# Patient Record
Sex: Female | Born: 1965 | Race: White | Hispanic: No | Smoking: Never smoker
Health system: Southern US, Community
[De-identification: ages and names within clinical notes are randomized; demographics above are authoritative.]

## PROBLEM LIST (undated history)

## (undated) DIAGNOSIS — I00 Rheumatic fever without heart involvement: Secondary | ICD-10-CM

## (undated) HISTORY — DX: Rheumatic fever without heart involvement: I00

## (undated) HISTORY — PX: OTHER SURGICAL HISTORY: SHX169

## (undated) HISTORY — PX: HYSTERECTOMY ABDOMINAL WITH SALPINGECTOMY: SHX6725

---

## 2015-02-07 ENCOUNTER — Ambulatory Visit (INDEPENDENT_AMBULATORY_CARE_PROVIDER_SITE_OTHER): Payer: BLUE CROSS/BLUE SHIELD | Admitting: Podiatry

## 2015-02-07 ENCOUNTER — Encounter: Payer: Self-pay | Admitting: Podiatry

## 2015-02-07 VITALS — BP 137/87 | HR 69 | Ht 66.0 in | Wt 151.0 lb

## 2015-02-07 DIAGNOSIS — M21969 Unspecified acquired deformity of unspecified lower leg: Secondary | ICD-10-CM

## 2015-02-07 DIAGNOSIS — M722 Plantar fascial fibromatosis: Secondary | ICD-10-CM | POA: Diagnosis not present

## 2015-02-07 DIAGNOSIS — M79673 Pain in unspecified foot: Secondary | ICD-10-CM

## 2015-02-07 NOTE — Patient Instructions (Signed)
Seen for right heel pain.  Cortisone injection given. Metatarsal binder x 1, size small dispensed. Return for custom orthotics.

## 2015-02-07 NOTE — Progress Notes (Signed)
Subjective: 49 year old female presents complaining of pain in right heel duration of 2 months. On feet 8-10 hours a day. Pain under right heel. Runs but not lately. Last ran on 01/04/15. Been stretching, wearing night splint and got arch support. These have not stopped her foot pain. Helps some but still hurting.  Pain starts when she gets up in the morning.   Review of Systems - Negative except Scoliosis and foot pain.  Objective: Neurovascular status are within normal. Vascular status are within normal. 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  Pain under right heel with pressure.   Assessment: Plantar fasciitis right heel. Metatarsal deformity right, elevated.   Plan:  Reviewed clinical findings and available treatment optiosn  Right heel injected with cortisone. Injected with mixture of 4 mg Dexamethasone, 4 mg Triamcinolone, and 1 cc of 0.5% Marcaine plain. Patient tolerated well without difficulty.  Metatarsal binder dispensed for right. Need custom orthotics. Continue stretch exercise and night splint.

## 2015-02-13 ENCOUNTER — Encounter: Payer: Self-pay | Admitting: Podiatry

## 2015-02-13 ENCOUNTER — Ambulatory Visit (INDEPENDENT_AMBULATORY_CARE_PROVIDER_SITE_OTHER): Payer: BLUE CROSS/BLUE SHIELD | Admitting: Podiatry

## 2015-02-13 DIAGNOSIS — M216X9 Other acquired deformities of unspecified foot: Secondary | ICD-10-CM

## 2015-02-13 DIAGNOSIS — M21969 Unspecified acquired deformity of unspecified lower leg: Secondary | ICD-10-CM | POA: Diagnosis not present

## 2015-02-13 DIAGNOSIS — M79673 Pain in unspecified foot: Secondary | ICD-10-CM | POA: Diagnosis not present

## 2015-02-13 DIAGNOSIS — M722 Plantar fascial fibromatosis: Secondary | ICD-10-CM

## 2015-02-13 NOTE — Patient Instructions (Signed)
Seen for right heel pain. Injection repeated on right heel. Ankle brace dispensed. Return for custom orthotics.

## 2015-02-13 NOTE — Progress Notes (Signed)
Subjective: 49 year old female presents stating right heel is still painful. The last injection helped for a week. Pain is not as bad as before, but still painful. The night splint is helping in the morning.   Objective: Neurovascular status are within normal. Vascular status are within normal. 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  Pain under right heel with pressure.  Radiographic examination reveal high arched foot with normal CYMA line. Severely elevated first ray bilateral noted. AP view shoe normal alignments.   Assessment: Plantar fasciitis right heel. Metatarsal deformity right, elevated.   Plan:  Reviewed clinical findings, X-ray findings, and available treatment options. Right heel repeated injection with cortisone. Injected with mixture of 4 mg Dexamethasone, 4 mg Triamcinolone, and 1 cc of 0.5% Marcaine plain. Patient tolerated well without difficulty.  Ankle brace dispensed.  Reviewed the need for custom orthotics. Continue stretch exercise and night splint.

## 2017-05-08 ENCOUNTER — Emergency Department (HOSPITAL_BASED_OUTPATIENT_CLINIC_OR_DEPARTMENT_OTHER)
Admission: EM | Admit: 2017-05-08 | Discharge: 2017-05-09 | Disposition: A | Discharge status: Home / Self Care | Payer: BLUE CROSS/BLUE SHIELD | Attending: Emergency Medicine | Admitting: Emergency Medicine

## 2017-05-08 ENCOUNTER — Encounter (HOSPITAL_BASED_OUTPATIENT_CLINIC_OR_DEPARTMENT_OTHER): Payer: Self-pay | Admitting: Emergency Medicine

## 2017-05-08 DIAGNOSIS — Y92488 Other paved roadways as the place of occurrence of the external cause: Secondary | ICD-10-CM | POA: Diagnosis not present

## 2017-05-08 DIAGNOSIS — S59901A Unspecified injury of right elbow, initial encounter: Secondary | ICD-10-CM | POA: Diagnosis present

## 2017-05-08 DIAGNOSIS — F10920 Alcohol use, unspecified with intoxication, uncomplicated: Secondary | ICD-10-CM

## 2017-05-08 DIAGNOSIS — F10929 Alcohol use, unspecified with intoxication, unspecified: Secondary | ICD-10-CM | POA: Diagnosis not present

## 2017-05-08 DIAGNOSIS — Z87891 Personal history of nicotine dependence: Secondary | ICD-10-CM | POA: Diagnosis not present

## 2017-05-08 DIAGNOSIS — Y999 Unspecified external cause status: Secondary | ICD-10-CM | POA: Diagnosis not present

## 2017-05-08 DIAGNOSIS — W19XXXA Unspecified fall, initial encounter: Secondary | ICD-10-CM

## 2017-05-08 DIAGNOSIS — Y9301 Activity, walking, marching and hiking: Secondary | ICD-10-CM | POA: Insufficient documentation

## 2017-05-08 DIAGNOSIS — W01198A Fall on same level from slipping, tripping and stumbling with subsequent striking against other object, initial encounter: Secondary | ICD-10-CM | POA: Diagnosis not present

## 2017-05-08 DIAGNOSIS — S42431A Displaced fracture (avulsion) of lateral epicondyle of right humerus, initial encounter for closed fracture: Secondary | ICD-10-CM | POA: Insufficient documentation

## 2017-05-08 NOTE — ED Triage Notes (Signed)
PT presents with fall and right elbow pain. Pt states she fell on concrete

## 2017-05-08 NOTE — ED Provider Notes (Signed)
[facility]   CSN: [id number] Arrival date & time: 05/08/17  2340  By signing my name below, I, Tara BrownerJennifer Sharp, attest that this documentation has been prepared under the direction and in the presence of Tara Sharp, Tara Maskerourtney F, MD. Electronically Signed: Diona BrownerJennifer Sharp, ED Scribe. 05/08/17. 11:58 PM.  History   Chief Complaint Chief Complaint  Patient presents with  . Fall    HPI Tara DallasSusan Hicks is a 51 y.o. female who presents to the Emergency Department complaining of right elbow pain s/p falling on concrete shortly prior to arrival. Pt was drinking and lost her balance. Reports that they noted a deformity to the elbow and "my arm was in the wrong direction." She has had multiple mixed drinks this evening, 05/08/17. Denies other injury. Rates pain at 4 out of 10. No hx heart disease. Pt denies LOC, head injury, and tingling.  The history is provided by the patient. No language interpreter was used.    History reviewed. No pertinent past medical history.  Patient Active Problem List   Diagnosis Date Noted  . Plantar fasciitis of right foot 02/07/2015  . Metatarsal deformity 02/07/2015    History reviewed. No pertinent surgical history.  OB History    No data available       Home Medications    Prior to Admission medications   Medication Sig Start Date End Date Taking? Authorizing Provider  HYDROcodone-acetaminophen (NORCO/VICODIN) 5-325 MG tablet Take 1 tablet by mouth every 6 (six) hours as needed. 05/09/17   Roran Wegner, Tara Maskerourtney F, MD    Family History No family history on file.  Social History Social History  Substance Use Topics  . Smoking status: Former Games developermoker  . Smokeless tobacco: Never Used  . Alcohol use Not on file     Allergies   Patient has no known allergies.   Review of Systems Review of Systems  Musculoskeletal: Positive for arthralgias.  Neurological: Negative for syncope, weakness and numbness.       -head injury. -  tingling.  All other systems reviewed and are negative.    Physical Exam Updated Vital Signs BP 106/70 (BP Location: Left Arm)   Pulse 72   Resp 18   Ht 5\' 6"  (1.676 m)   Wt 72.6 kg (160 lb)   LMP 04/15/2017   SpO2 96%   BMI 25.82 kg/m   Physical Exam  Constitutional:  Intoxicated middle-aged female in no acute distress  HENT:  Head: Normocephalic and atraumatic.  Cardiovascular: Normal rate, regular rhythm and normal heart sounds.   Pulmonary/Chest: Effort normal and breath sounds normal. No respiratory distress. She has no wheezes.  Abdominal: Soft. There is no tenderness.  Musculoskeletal:  No obvious deformity to the elbow, no open wounds, patient with range of motion however cannot fully extend the elbow secondary to pain, 2+ radial pulse, neurovascularly intact distally, no wrist or shoulder deformities with normal range of motion  Neurological: She is alert.  Skin: Skin is warm and dry.  Psychiatric: She has a normal mood and affect.  Intoxicated  Nursing note and vitals reviewed.    ED Treatments / Results  DIAGNOSTIC STUDIES: Oxygen Saturation is 96% on RA, adequate by my interpretation.   COORDINATION OF CARE: 11:58 PM-Discussed next steps with pt which includes an XR. Pt verbalized understanding and is agreeable with the plan.   Labs (all labs ordered are listed, but only abnormal results are displayed) Labs Reviewed - No data to display  EKG  EKG Interpretation  None       Radiology Dg Elbow Complete Right  Result Date: 05/09/2017 CLINICAL DATA:  Status post fall, with right elbow pain. Initial encounter. EXAM: RIGHT ELBOW - COMPLETE 3+ VIEW COMPARISON:  None. FINDINGS: Multiple apparent avulsion fragments are seen arising about the lateral humeral condyle. An elbow joint effusion is suspected. The radial head and proximal ulna appear grossly intact, with mild degenerative change about the coronoid process. No definite soft tissue abnormalities are  otherwise seen. IMPRESSION: Multiple apparent avulsion fracture fragments arising about the lateral humeral condyle. Suspect elbow joint effusion. Electronically Signed   By: Tara Sharp M.D.   On: 05/09/2017 00:19    Procedures Procedures (including critical care time)  Medications Ordered in ED Medications  ibuprofen (ADVIL,MOTRIN) 800 MG tablet (800 mg  Given 05/09/17 0108)     Initial Impression / Assessment and Plan / ED Course  I have reviewed the triage vital signs and the nursing notes.  Pertinent labs & imaging results that were available during my care of the patient were reviewed by me and considered in my medical decision making (see chart for details).     Patient presents following a fall. Reports mechanical fall area is intoxicated. Only endorses pain to the right elbow. No obvious deformity at this time but reported deformity prior to arrival. She's neurovascularly intact. Somewhat limited extension but can range both flex and extend at the elbow. It does not appear open. X-rays with multiple small avulsion fractures of the lateral condyle as well as a joint effusion which could represent an occult radial head fracture. For this reason, place and was placed in a posterior splint. Her exam is somewhat limited with her alcohol intoxication. Repeat exam I can extend her elbow fully. Feel she needs a formal repeat exam when she is not intoxicated. I discussed this with her and her significant other at the bedside. Patient stated understanding.  After history, exam, and medical workup I feel the patient has been appropriately medically screened and is safe for discharge home. Pertinent diagnoses were discussed with the patient. Patient was given return precautions.   Final Clinical Impressions(s) / ED Diagnoses   Final diagnoses:  Fall, initial encounter  Closed displaced avulsion fracture of lateral epicondyle of right humerus, initial encounter  Alcoholic intoxication  without complication (HCC)    New Prescriptions There are no discharge medications for this patient.  I personally performed the services described in this documentation, which was scribed in my presence. The recorded information has been reviewed and is accurate.     Shon Baton, MD 05/09/17 [phone]

## 2017-05-09 ENCOUNTER — Emergency Department (HOSPITAL_BASED_OUTPATIENT_CLINIC_OR_DEPARTMENT_OTHER): Payer: BLUE CROSS/BLUE SHIELD

## 2017-05-09 MED ORDER — HYDROCODONE-ACETAMINOPHEN 5-325 MG PO TABS: 1.0000 | ORAL_TABLET | Freq: Four times a day (QID) | ORAL | 0 refills | Status: DC | PRN

## 2017-05-09 MED ORDER — IBUPROFEN 800 MG PO TABS
ORAL_TABLET | ORAL | Status: AC
  Administered 2017-05-09: 800 mg
  Filled 2017-05-09: qty 1

## 2017-05-09 NOTE — Discharge Instructions (Signed)
You were seen today for injury to right elbow. You have small avulsion fractures over the lateral condyle of the elbow. Given limited exam, need to have repeat exam Monday or Tuesday of this week for range of motion and further evaluation. You will be placed in a splint until that time.

## 2018-07-01 IMAGING — DX DG ELBOW COMPLETE 3+V*R*
4 series · 4 of 4 positions shown · non-contrast
Comparison: None.

CLINICAL DATA: Status post fall, with right elbow pain. Initial
encounter.

EXAM:
RIGHT ELBOW - COMPLETE 3+ VIEW

[elbow ap]
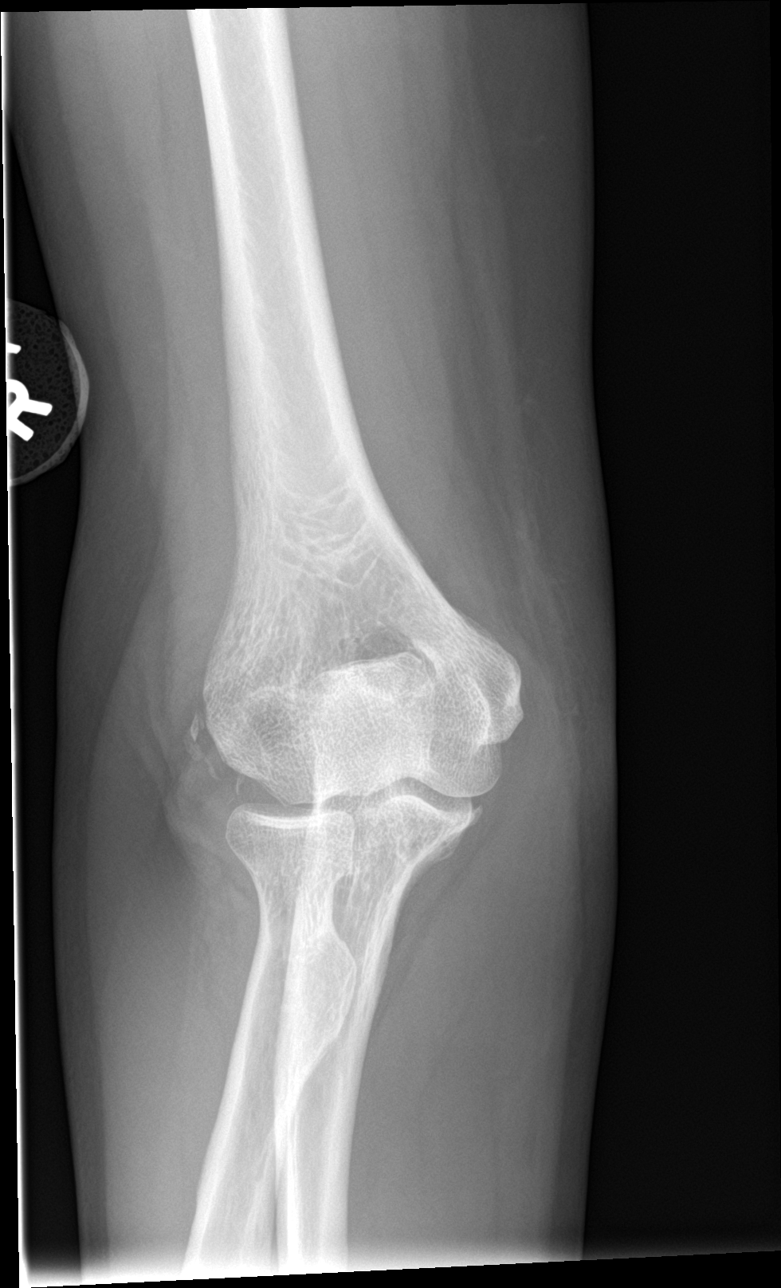

[elbow obl (1 of 2)]
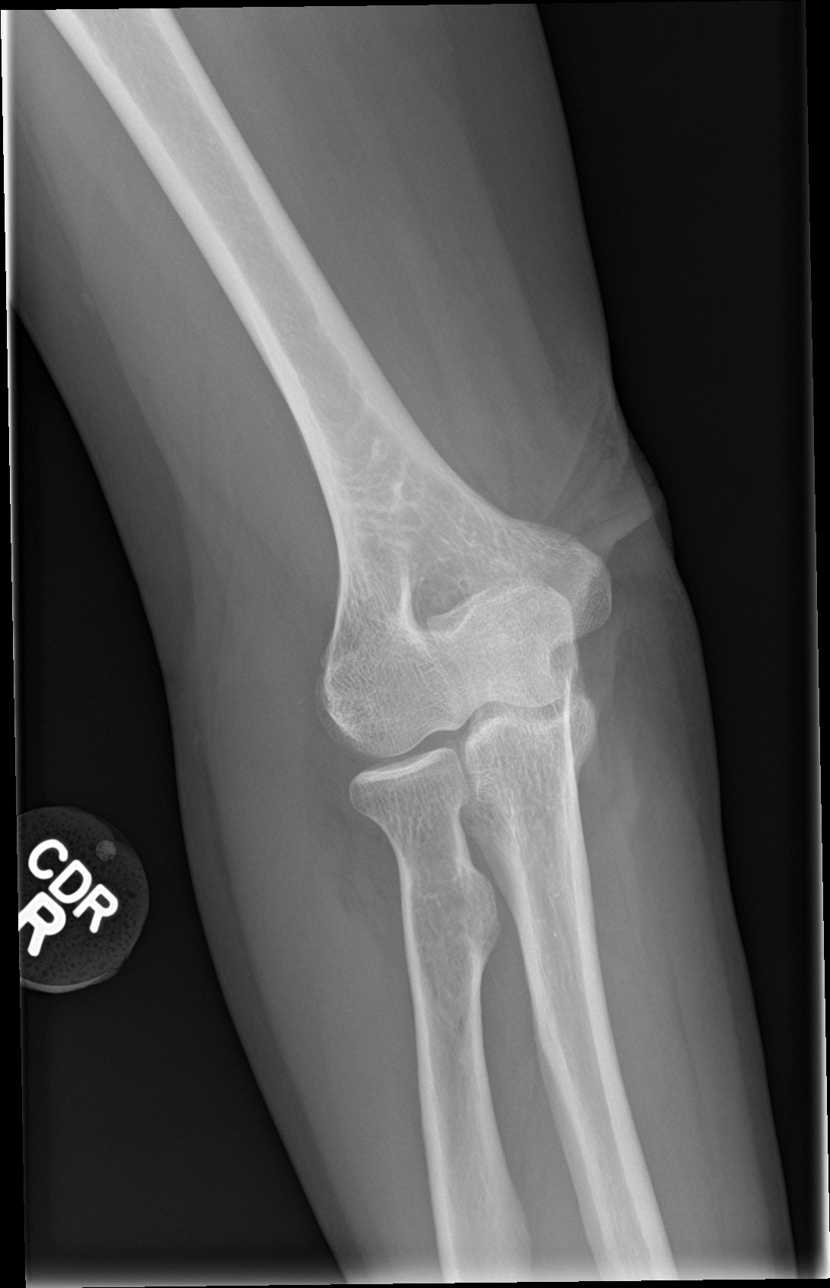

[elbow obl (2 of 2)]
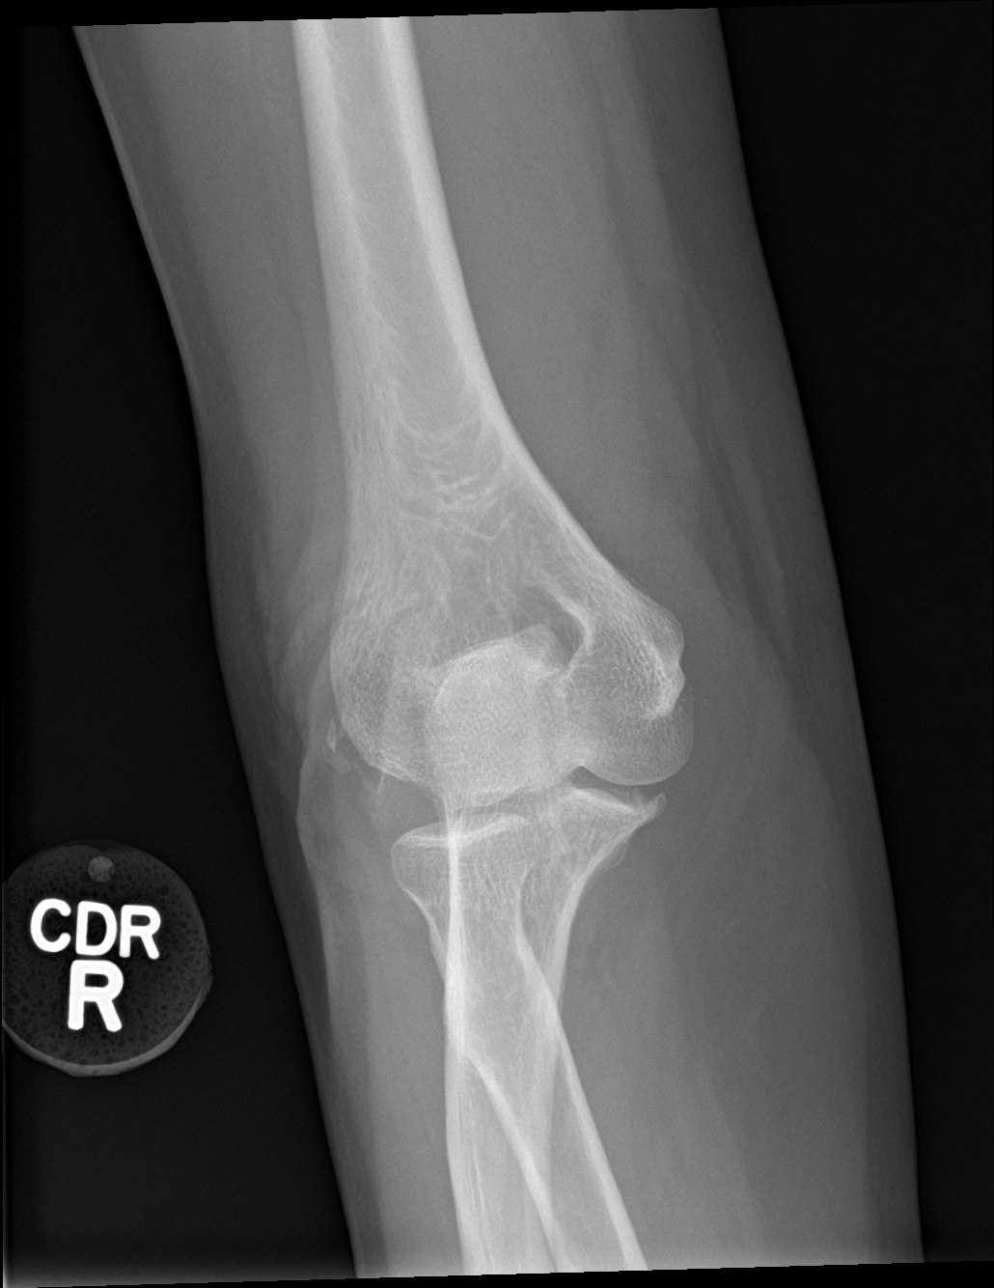

[elbow lat]
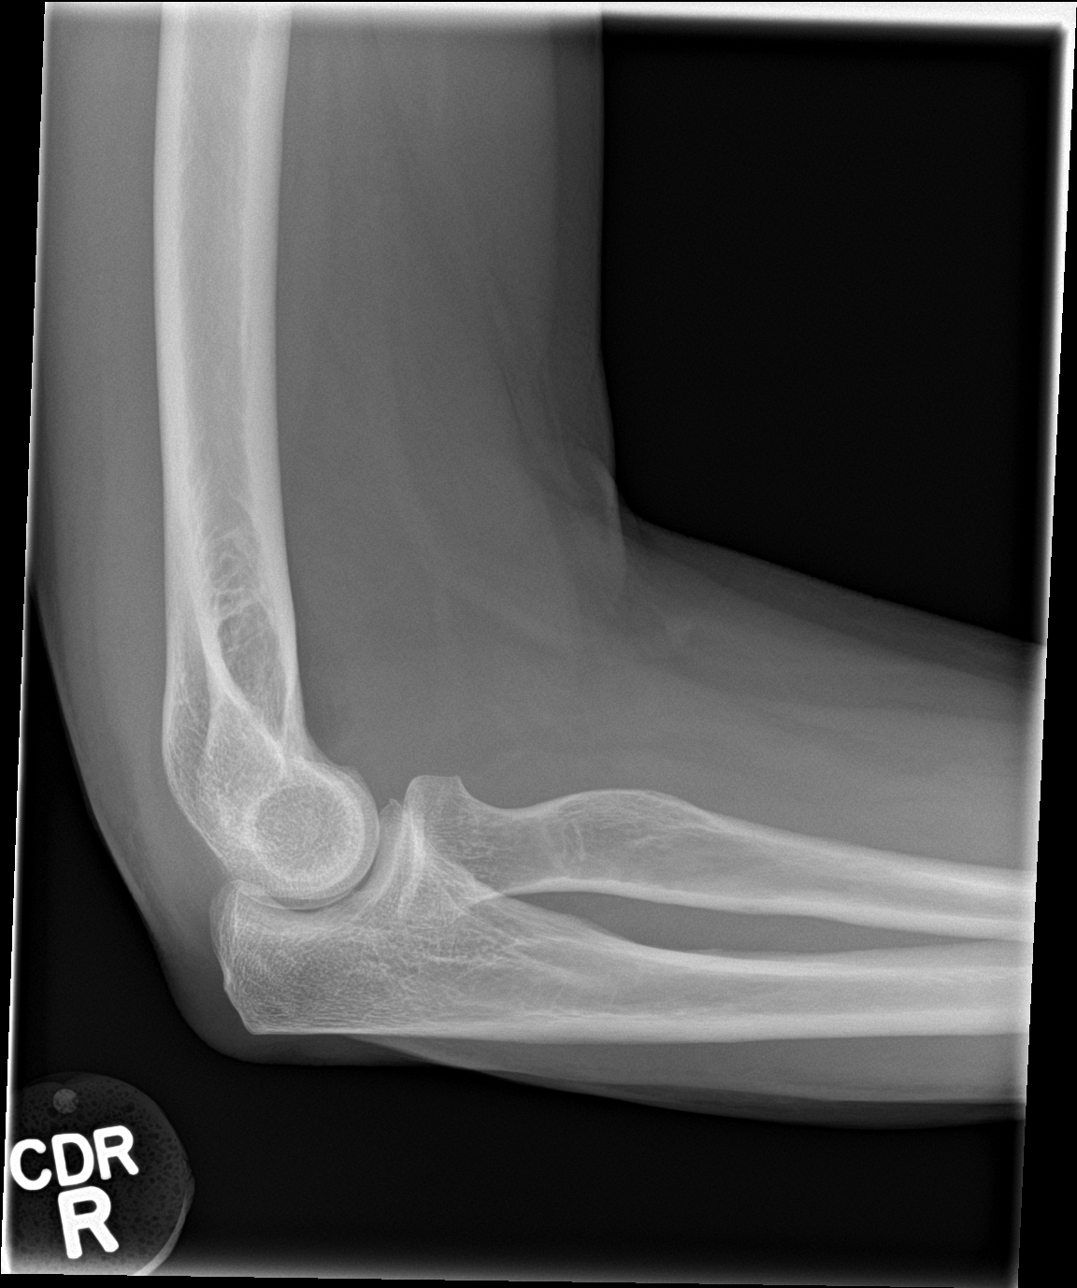

[4 of 4 positions shown; findings below may reference images not displayed]

FINDINGS: Multiple apparent avulsion fragments are seen arising about the
lateral humeral condyle. An elbow joint effusion is suspected. The
radial head and proximal ulna appear grossly intact, with mild
degenerative change about the coronoid process. No definite soft
tissue abnormalities are otherwise seen.
IMPRESSION: Multiple apparent avulsion fracture fragments arising about the
lateral humeral condyle. Suspect elbow joint effusion.

## 2020-02-02 ENCOUNTER — Ambulatory Visit: Payer: BC Managed Care – PPO | Attending: Internal Medicine

## 2020-02-02 DIAGNOSIS — Z23 Encounter for immunization: Secondary | ICD-10-CM

## 2020-02-02 NOTE — Progress Notes (Signed)
   Covid-19 Vaccination Clinic  Name:  Destane Speas    MRN: [id number] DOB: 04-Oct-1966  02/02/2020  Ms. Belleville was observed post Covid-19 immunization for 15 minutes without incident. She was provided with Vaccine Information Sheet and instruction to access the V-Safe system.   Ms. Juncaj was instructed to call 911 with any severe reactions post vaccine: Marland Kitchen Difficulty breathing  . Swelling of face and throat  . A fast heartbeat  . A bad rash all over body  . Dizziness and weakness   Immunizations Administered    Name Date Dose VIS Date Route   Pfizer COVID-19 Vaccine 02/02/2020  2:57 PM 0.3 mL 11/04/2019 Intramuscular   Manufacturer: [employer], [employer]   Lot: [id number]   NDC: [address]-9

## 2020-02-27 ENCOUNTER — Ambulatory Visit: Payer: BC Managed Care – PPO | Attending: Internal Medicine

## 2020-02-27 DIAGNOSIS — Z23 Encounter for immunization: Secondary | ICD-10-CM

## 2020-02-27 NOTE — Progress Notes (Signed)
   Covid-19 Vaccination Clinic  Name:  Tara Sharp    MRN: [id number] DOB: 15-May-1966  02/27/2020  [name] was observed post Covid-19 immunization for 15 minutes without incident. She was provided with Vaccine Information Sheet and instruction to access the V-Safe system.   [name] was instructed to call 911 with any severe reactions post vaccine: Marland Kitchen Difficulty breathing  . Swelling of face and throat  . A fast heartbeat  . A bad rash all over body  . Dizziness and weakness   Immunizations Administered    Name Date Dose VIS Date Route   Pfizer COVID-19 Vaccine 02/27/2020  4:31 PM 0.3 mL 11/04/2019 Intramuscular   Manufacturer: [employer], [employer]   Lot: [id number]   NDC: [address]-6

## 2021-11-18 DIAGNOSIS — Z20822 Contact with and (suspected) exposure to covid-19: Secondary | ICD-10-CM | POA: Diagnosis not present

## 2021-11-18 DIAGNOSIS — U071 COVID-19: Secondary | ICD-10-CM | POA: Diagnosis not present

## 2023-04-30 ENCOUNTER — Encounter: Payer: Self-pay | Admitting: Internal Medicine

## 2023-04-30 ENCOUNTER — Ambulatory Visit: Payer: BC Managed Care – PPO | Admitting: Internal Medicine

## 2023-04-30 VITALS — BP 140/100 | HR 77 | Temp 98.2°F | Resp 18 | Ht 66.0 in | Wt 157.2 lb

## 2023-04-30 DIAGNOSIS — G8929 Other chronic pain: Secondary | ICD-10-CM | POA: Diagnosis not present

## 2023-04-30 DIAGNOSIS — M549 Dorsalgia, unspecified: Secondary | ICD-10-CM | POA: Insufficient documentation

## 2023-04-30 DIAGNOSIS — M4125 Other idiopathic scoliosis, thoracolumbar region: Secondary | ICD-10-CM

## 2023-04-30 DIAGNOSIS — Z6825 Body mass index (BMI) 25.0-25.9, adult: Secondary | ICD-10-CM

## 2023-04-30 DIAGNOSIS — M5441 Lumbago with sciatica, right side: Secondary | ICD-10-CM | POA: Diagnosis not present

## 2023-04-30 DIAGNOSIS — I10 Essential (primary) hypertension: Secondary | ICD-10-CM

## 2023-04-30 DIAGNOSIS — M419 Scoliosis, unspecified: Secondary | ICD-10-CM | POA: Insufficient documentation

## 2023-04-30 MED ORDER — IRBESARTAN 150 MG PO TABS: 150.0000 mg | ORAL_TABLET | Freq: Every day | ORAL | 3 refills | Status: DC

## 2023-04-30 NOTE — Assessment & Plan Note (Signed)
She will take one take one tylenol arthritis three time a day and may add NSAID with it, She will follow with chiropractioner

## 2023-04-30 NOTE — Assessment & Plan Note (Signed)
Will monitor

## 2023-04-30 NOTE — Assessment & Plan Note (Signed)
She will cut down salt in her diet and I will start irbesartan one tablet daily, I will also do labs today to see renal function.

## 2023-04-30 NOTE — Progress Notes (Signed)
New Patient Office Visit  Subjective    Patient ID: Tara Sharp, female    DOB: 03-29-1966  Age: 57 y.o. MRN: [id number]  CC:  Chief Complaint  Patient presents with   New Patient (Initial Visit)    High Blood Pressure    HPI Tara Sharp presents to establish care with [place]. She has left lower back pain radiating to left leg and posterior shoulder pain with left wrist pain, she went to see chiropractic yesterday and her blood pressure was high so she is here for that. She never knew that her blood pressure was high. She says for the past few months she has been taking a lot of ibuprofen because pain and wonder if that has anything to it.     Outpatient Encounter Medications as of 04/30/2023  Medication Sig   [DISCONTINUED] HYDROcodone-acetaminophen (NORCO/VICODIN) 5-325 MG tablet Take 1 tablet by mouth every 6 (six) hours as needed.   No facility-administered encounter medications on file as of 04/30/2023.    Past Medical History:  Diagnosis Date   Rheumatic fever    age 66 years old    Past Surgical History:  Procedure Laterality Date   HYSTERECTOMY ABDOMINAL WITH SALPINGECTOMY     Laproscopic to remove scar tissue       Family History  Problem Relation Age of Onset   Heart disease Mother    Diabetes Mother    Lumbar disc disease Mother    Aortic aneurysm Father    High blood pressure Father    COPD Father     Social History   Socioeconomic History   Marital status: Single    Spouse name: Not on file   Number of children: Not on file   Years of education: Not on file   Highest education level: Not on file  Occupational History   Not on file  Tobacco Use   Smoking status: Former   Smokeless tobacco: Never  Substance and Sexual Activity   Alcohol use: Not on file   Drug use: Not on file   Sexual activity: Not on file  Other Topics Concern   Not on file  Social History Narrative   Not on file   Social Determinants of Health   Financial Resource  Strain: Not on file  Food Insecurity: Not on file  Transportation Needs: Not on file  Physical Activity: Not on file  Stress: Not on file  Social Connections: Not on file  Intimate Partner Violence: Not on file    Review of Systems  Constitutional: Negative.   HENT: Negative.    Respiratory: Negative.    Cardiovascular: Negative.   Gastrointestinal: Negative.   Musculoskeletal:  Positive for back pain.  Neurological: Negative.         Objective    BP (!) 140/100 (BP Location: Left Arm, Patient Position: Sitting, Cuff Size: Normal)   Pulse 77   Temp 98.2 F (36.8 C)   Resp 18   Ht 5\' 6"  (1.676 m)   Wt 157 lb 4 oz (71.3 kg)   LMP 04/15/2017   SpO2 96%   BMI 25.38 kg/m   Physical Exam Constitutional:      Appearance: Normal appearance.  Cardiovascular:     Rate and Rhythm: Normal rate and regular rhythm.     Heart sounds: Normal heart sounds.  Pulmonary:     Effort: Pulmonary effort is normal.     Breath sounds: Normal breath sounds.  Musculoskeletal:     Comments: Mild  scoliosis  Neurological:     Mental Status: She is alert and oriented to person, place, and time.         Assessment & Plan:   Problem List Items Addressed This Visit       Cardiovascular and Mediastinum   Primary hypertension - Primary    She will cut down salt in her diet and I will start irbesartan one tablet daily, I will also do labs today to see renal function.        Musculoskeletal and Integument   Scoliosis    Will monitor        Other   Back pain    She will take one take one tylenol arthritis three time a day and may add NSAID with it, She will follow with chiropractioner       Return in about 2 weeks (around 05/14/2023).   Eloisa Northern, MD

## 2023-05-01 LAB — CMP14 + ANION GAP
ALT: 62 IU/L — ABNORMAL HIGH (ref 0–32)
AST: 47 IU/L — ABNORMAL HIGH (ref 0–40)
Albumin/Globulin Ratio: 1.7 (ref 1.2–2.2)
Albumin: 4.7 g/dL (ref 3.8–4.9)
Alkaline Phosphatase: 86 IU/L (ref 44–121)
Anion Gap: 16 mmol/L (ref 10.0–18.0)
BUN/Creatinine Ratio: 16 (ref 9–23)
BUN: 8 mg/dL (ref 6–24)
Bilirubin Total: 1.2 mg/dL (ref 0.0–1.2)
CO2: 26 mmol/L (ref 20–29)
Calcium: 9.7 mg/dL (ref 8.7–10.2)
Chloride: 99 mmol/L (ref 96–106)
Creatinine, Ser: 0.51 mg/dL — ABNORMAL LOW (ref 0.57–1.00)
Globulin, Total: 2.8 g/dL (ref 1.5–4.5)
Glucose: 112 mg/dL — ABNORMAL HIGH (ref 70–99)
Potassium: 3.8 mmol/L (ref 3.5–5.2)
Sodium: 141 mmol/L (ref 134–144)
Total Protein: 7.5 g/dL (ref 6.0–8.5)
eGFR: 108 mL/min/{1.73_m2} (ref >59)

## 2023-05-14 ENCOUNTER — Ambulatory Visit: Payer: BC Managed Care – PPO | Admitting: Internal Medicine

## 2023-05-21 ENCOUNTER — Ambulatory Visit: Payer: BC Managed Care – PPO | Admitting: Internal Medicine

## 2023-05-21 ENCOUNTER — Encounter: Payer: Self-pay | Admitting: Internal Medicine

## 2023-05-21 VITALS — BP 130/90 | HR 85 | Temp 98.1°F | Resp 18 | Ht 66.0 in | Wt 157.2 lb

## 2023-05-21 DIAGNOSIS — M5441 Lumbago with sciatica, right side: Secondary | ICD-10-CM

## 2023-05-21 DIAGNOSIS — I10 Essential (primary) hypertension: Secondary | ICD-10-CM | POA: Diagnosis not present

## 2023-05-21 DIAGNOSIS — M4125 Other idiopathic scoliosis, thoracolumbar region: Secondary | ICD-10-CM

## 2023-05-21 DIAGNOSIS — M542 Cervicalgia: Secondary | ICD-10-CM | POA: Diagnosis not present

## 2023-05-21 DIAGNOSIS — G8929 Other chronic pain: Secondary | ICD-10-CM

## 2023-05-21 DIAGNOSIS — R7989 Other specified abnormal findings of blood chemistry: Secondary | ICD-10-CM

## 2023-05-21 MED ORDER — DICLOFENAC SODIUM 75 MG PO TBEC: 75.0000 mg | DELAYED_RELEASE_TABLET | Freq: Two times a day (BID) | ORAL | 1 refills | Status: DC

## 2023-05-21 MED ORDER — HYDROCHLOROTHIAZIDE 12.5 MG PO TABS: 12.5000 mg | ORAL_TABLET | Freq: Every day | ORAL | 6 refills | Status: DC

## 2023-05-21 MED ORDER — GABAPENTIN 300 MG PO CAPS: 300.0000 mg | ORAL_CAPSULE | Freq: Three times a day (TID) | ORAL | 2 refills | Status: DC

## 2023-05-21 NOTE — Addendum Note (Signed)
Addended byEloisa Northern on: 05/21/2023 10:05 AM   Modules accepted: Level of Service

## 2023-05-21 NOTE — Assessment & Plan Note (Signed)
I will start her on gabapentin 300 at nighttime for 3 days then 1 twice a day for 3 days then 3 times a day if there is no side effect.  I will also start her on diclofenac sodium 75 mg twice a day for 2 weeks then as needed.  She will see physical therapist.

## 2023-05-21 NOTE — Assessment & Plan Note (Signed)
Patient is better but still diastolic is 90 so I will add hydrochlorothiazide 12.5 mg daily and will combine both medicine on next visit.

## 2023-05-21 NOTE — Progress Notes (Signed)
Office Visit  Subjective   Patient ID: Tara Sharp   DOB: 06/10/1966   Age: 57 y.o.   MRN: [id number]   Chief Complaint Chief Complaint  Patient presents with   Hypertension    2 week follow up     History of Present Illness 22 yeas old female is here for follow up. She she is taking blood pressure medicine except will be when she forgets to take irbesartan.  Her blood pressure is better but still high.  I have discussed with her that I will add hydrochlorothiazide and then we will combine both medicine on next visit. He is still having lower back pain radiating to right leg and neck pain radiating to left arm with numbness and weakness in her left arm.  Numbness is mostly on the thumb and index finger of left arm.  This started after she lifted a box any immediately after that he having back and neck pain.  She says that her numbness is better but she still has it and it bother her.  She is following chiropractors. I have reviewed her labs with her and her liver function is abnormal and she admits that she has been drinking very heavily but she has significantly decreased and planning to quit altogether. She works in [employer] and quit smoking 20 years ago. She says that she did not have physical in a long time.   Past Medical History Past Medical History:  Diagnosis Date   Rheumatic fever    age 42 years old     Allergies No Known Allergies   Review of Systems Review of Systems  Constitutional: Negative.   HENT: Negative.    Respiratory: Negative.    Cardiovascular: Negative.   Gastrointestinal: Negative.   Musculoskeletal:  Positive for back pain and neck pain.  Neurological:  Positive for focal weakness.       Objective:    Vitals BP (!) 130/90 (BP Location: Left Arm, Patient Position: Sitting, Cuff Size: Normal)   Pulse 85   Temp 98.1 F (36.7 C)   Resp 18   Ht 5\' 6"  (1.676 m)   Wt 157 lb 4 oz (71.3 kg)   LMP 04/15/2017   SpO2 98%   BMI 25.38 kg/m     Physical Examination Physical Exam Constitutional:      Appearance: Normal appearance.  HENT:     Head: Normocephalic and atraumatic.  Cardiovascular:     Rate and Rhythm: Normal rate and regular rhythm.     Heart sounds: Normal heart sounds.  Pulmonary:     Effort: Pulmonary effort is normal.     Breath sounds: Normal breath sounds.  Abdominal:     General: Bowel sounds are normal.     Palpations: Abdomen is soft.  Neurological:     General: No focal deficit present.     Mental Status: She is alert and oriented to person, place, and time.        Assessment & Plan:   Primary hypertension Patient is better but still diastolic is 90 so I will add hydrochlorothiazide 12.5 mg daily and will combine both medicine on next visit.  Back pain I will start her on gabapentin 300 at nighttime for 3 days then 1 twice a day for 3 days then 3 times a day if there is no side effect.  I will also start her on diclofenac sodium 75 mg twice a day for 2 weeks then as needed.  She will see  physical therapist.  Neck pain on left side Neck pain with radiation to left arm with numbness in the distribution of radial nerve and weakness.  She will seek physical therapist help and continue with the gabapentin and diclofenac sodium.  If weakness and numbness is not better she may need MRI.  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  She only drinks twice a week and wanted to plan stop that.  May need to do ultrasound of liver and hepatitis panel on next visit.   Will do PE on next visit  Return in about 3 months (around 08/21/2023).   Eloisa Northern, MD

## 2023-05-21 NOTE — Assessment & Plan Note (Signed)
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  She only drinks twice a week and wanted to plan stop that.  May need to do ultrasound of liver and hepatitis panel on next visit.

## 2023-05-21 NOTE — Assessment & Plan Note (Signed)
Neck pain with radiation to left arm with numbness in the distribution of radial nerve and weakness.  She will seek physical therapist help and continue with the gabapentin and diclofenac sodium.  If weakness and numbness is not better she may need MRI.

## 2023-08-20 ENCOUNTER — Ambulatory Visit: Payer: BC Managed Care – PPO | Admitting: Internal Medicine

## 2023-08-20 ENCOUNTER — Encounter: Payer: Self-pay | Admitting: Internal Medicine

## 2023-08-20 VITALS — BP 126/88 | HR 73 | Temp 98.5°F | Resp 18 | Ht 66.0 in | Wt 163.5 lb

## 2023-08-20 DIAGNOSIS — Z131 Encounter for screening for diabetes mellitus: Secondary | ICD-10-CM | POA: Diagnosis not present

## 2023-08-20 DIAGNOSIS — G8929 Other chronic pain: Secondary | ICD-10-CM

## 2023-08-20 DIAGNOSIS — M4125 Other idiopathic scoliosis, thoracolumbar region: Secondary | ICD-10-CM

## 2023-08-20 DIAGNOSIS — I10 Essential (primary) hypertension: Secondary | ICD-10-CM

## 2023-08-20 DIAGNOSIS — R7989 Other specified abnormal findings of blood chemistry: Secondary | ICD-10-CM

## 2023-08-20 DIAGNOSIS — M25551 Pain in right hip: Secondary | ICD-10-CM

## 2023-08-20 DIAGNOSIS — M5441 Lumbago with sciatica, right side: Secondary | ICD-10-CM | POA: Diagnosis not present

## 2023-08-20 DIAGNOSIS — Z23 Encounter for immunization: Secondary | ICD-10-CM | POA: Diagnosis not present

## 2023-08-20 DIAGNOSIS — Z6826 Body mass index (BMI) 26.0-26.9, adult: Secondary | ICD-10-CM

## 2023-08-20 DIAGNOSIS — Z0000 Encounter for general adult medical examination without abnormal findings: Secondary | ICD-10-CM

## 2023-08-20 DIAGNOSIS — M542 Cervicalgia: Secondary | ICD-10-CM

## 2023-08-20 MED ORDER — TETANUS-DIPHTH-ACELL PERTUSSIS 5-2.5-18.5 LF-MCG/0.5 IM SUSP: 0.5000 mL | Freq: Once | INTRAMUSCULAR | 0 refills | Status: AC

## 2023-08-20 MED ORDER — IRBESARTAN-HYDROCHLOROTHIAZIDE 150-12.5 MG PO TABS: 1.0000 | ORAL_TABLET | Freq: Every day | ORAL | 6 refills | Status: DC

## 2023-08-20 NOTE — Patient Instructions (Signed)
Give her fit test kit.  I will also give her a flu shot today.

## 2023-08-20 NOTE — Assessment & Plan Note (Signed)
I will do hemoglobin A1c.

## 2023-08-20 NOTE — Assessment & Plan Note (Signed)
I will do x-ray of right hip.

## 2023-08-20 NOTE — Assessment & Plan Note (Signed)
Unchanged

## 2023-08-20 NOTE — Assessment & Plan Note (Signed)
She has cut back significantly alcohol intake. I will repeat liver function today.  If still abnormal I will do hepatitis panel and liver ultrasound as well.

## 2023-08-20 NOTE — Assessment & Plan Note (Signed)
Back pain with the right leg radiculopathy.  I will do MRI of lumbar spine and then reevaluate.  She was seen by chiropractors and has completed physical therapy.  She says that diclofenac sodium and gabapentin are helping but she still has significant pain that get worse at the end of the day.

## 2023-08-20 NOTE — Assessment & Plan Note (Signed)
She has received physical therapy and chiropractor has worked with her.  She has a weakness of left arm with atrophy of left hand.  I will do MRI of her neck and may also need nerve conduction study to study and to decide where to refer her.

## 2023-08-20 NOTE — Assessment & Plan Note (Signed)
Controlled.  

## 2023-08-20 NOTE — Progress Notes (Addendum)
Office Visit  Subjective   Patient ID: Tara Sharp   DOB: Apr 01, 1966   Age: 57 y.o.   MRN: [id number]   Chief Complaint Chief Complaint  Patient presents with   Annual Exam    Physical Annual / Fasting     History of Present Illness 57 years old female is here for annual physical examination. She has hypertension and says that some time she for get to take irbesartan. She does not check check her blood pressure at home.    She has back pain started 6 months ago after she was lifting and cleaning yard work. She take gabapentin and diclofenac sodium and that is helping. She took PT and that has helped some, she also saw chiropractic for back and neck pain. Neck pain started at the same time, neck pain radiate to left arm and gabapentin did help but she feel her muscle are getting atrophied now and PT did not help much.   She is living with her boyfriend. 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 She does not smoke, she quit smoking 21 years ago. She cut back significant amount of drinking. She still drink 4-5 days a week, last night she drink 2 glass of wines and few beers.  She does not use any drugs.   Her mother has diabetes, her father has kidney cancer.  She examine her breast regularly but have not get mammogram in few years, she says she get free mammogram and she will get that. She does not have pap smear in a long time. She has vaginal hysterectomy. She never has colonoscopy. She wanted FIT test.   She has COVID vaccine last year, she has flu shot last year. She wants flu shot. She has 2 does dose of shingle vaccine.     Past Medical History Past Medical History:  Diagnosis Date   Rheumatic fever    age 35 years old     Allergies No Known Allergies   Review of Systems Review of Systems  Constitutional: Negative.   HENT: Negative.    Respiratory: Negative.    Cardiovascular: Negative.    Gastrointestinal: Negative.   Musculoskeletal:  Positive for back pain and neck pain.  Neurological: Negative.        Left arm weakness.       Objective:    Vitals BP 126/88 (BP Location: Left Arm, Patient Position: Sitting, Cuff Size: Normal)   Pulse 73   Temp 98.5 F (36.9 C)   Resp 18   Ht 5\' 6"  (1.676 m)   Wt 163 lb 8 oz (74.2 kg)   LMP 04/15/2017   SpO2 96%   BMI 26.39 kg/m    Physical Examination Physical Exam Constitutional:      Appearance: Normal appearance.  HENT:     Head: Normocephalic and atraumatic.  Cardiovascular:     Rate and Rhythm: Normal rate and regular rhythm.     Heart sounds: Normal heart sounds.  Pulmonary:     Effort: Pulmonary effort is normal.     Breath sounds: Normal breath sounds.  Abdominal:     General: Bowel sounds are normal.     Palpations: Abdomen is soft.  Musculoskeletal:     Comments: She has scoliosis of spine and pain on movement of right hip  Neurological:     Mental Status: She is alert and oriented to  person, place, and time.     Comments: Left arm Weakness with atrophy of left hand muscles.  She also has hyperreflexia of left leg.        Assessment & Plan:   Primary hypertension Controlled.  Scoliosis Unchanged  Abnormal liver function test She has cut back significantly alcohol intake. I will repeat liver function today.  If still abnormal I will do hepatitis panel and liver ultrasound as well.  Back pain Back pain with the right leg radiculopathy.  I will do MRI of lumbar spine and then reevaluate.  She was seen by chiropractors and has completed physical therapy.  She says that diclofenac sodium and gabapentin are helping but she still has significant pain that get worse at the end of the day.  Neck pain on left side She has received physical therapy and chiropractor has worked with her.  She has a weakness of left arm with atrophy of left hand.  I will do MRI of her neck and may also need nerve conduction  study to study and to decide where to refer her.  Right hip pain I will do x-ray of right hip.  Screening for diabetes mellitus (DM) I will do hemoglobin A1c.    Return in about 1 month (around 09/19/2023).   Eloisa Northern, MD

## 2023-08-21 ENCOUNTER — Other Ambulatory Visit: Payer: Self-pay

## 2023-08-21 LAB — CBC WITH DIFFERENTIAL/PLATELET
Basophils Absolute: 0 10*3/uL (ref 0.0–0.2)
Basos: 1 %
EOS (ABSOLUTE): 0.1 10*3/uL (ref 0.0–0.4)
Eos: 2 %
Hematocrit: 42.7 % (ref 34.0–46.6)
Hemoglobin: 14.1 g/dL (ref 11.1–15.9)
Immature Grans (Abs): 0 10*3/uL (ref 0.0–0.1)
Immature Granulocytes: 0 %
Lymphocytes Absolute: 1 10*3/uL (ref 0.7–3.1)
Lymphs: 27 %
MCH: 33.4 pg — ABNORMAL HIGH (ref 26.6–33.0)
MCHC: 33 g/dL (ref 31.5–35.7)
MCV: 101 fL — ABNORMAL HIGH (ref 79–97)
Monocytes Absolute: 0.4 10*3/uL (ref 0.1–0.9)
Monocytes: 11 %
Neutrophils Absolute: 2.2 10*3/uL (ref 1.4–7.0)
Neutrophils: 59 %
Platelets: 228 10*3/uL (ref 150–450)
RBC: 4.22 x10E6/uL (ref 3.77–5.28)
RDW: 12.2 % (ref 11.7–15.4)
WBC: 3.6 10*3/uL (ref 3.4–10.8)

## 2023-08-21 LAB — CMP14 + ANION GAP
ALT: 19 [IU]/L (ref 0–32)
AST: 21 [IU]/L (ref 0–40)
Albumin: 4.3 g/dL (ref 3.8–4.9)
Alkaline Phosphatase: 84 [IU]/L (ref 44–121)
Anion Gap: 14 mmol/L (ref 10.0–18.0)
BUN/Creatinine Ratio: 24 — ABNORMAL HIGH (ref 9–23)
BUN: 13 mg/dL (ref 6–24)
Bilirubin Total: 0.6 mg/dL (ref 0.0–1.2)
CO2: 27 mmol/L (ref 20–29)
Calcium: 9.8 mg/dL (ref 8.7–10.2)
Chloride: 97 mmol/L (ref 96–106)
Creatinine, Ser: 0.55 mg/dL — ABNORMAL LOW (ref 0.57–1.00)
Globulin, Total: 2.9 g/dL (ref 1.5–4.5)
Glucose: 107 mg/dL — ABNORMAL HIGH (ref 70–99)
Potassium: 4.1 mmol/L (ref 3.5–5.2)
Sodium: 138 mmol/L (ref 134–144)
Total Protein: 7.2 g/dL (ref 6.0–8.5)
eGFR: 106 mL/min/{1.73_m2} (ref >59)

## 2023-08-21 LAB — HEMOGLOBIN A1C
Est. average glucose Bld gHb Est-mCnc: 97 mg/dL
Hgb A1c MFr Bld: 5 % (ref 4.8–5.6)

## 2023-08-21 LAB — LIPID PANEL
Chol/HDL Ratio: 1.9 {ratio} (ref 0.0–4.4)
Cholesterol, Total: 234 mg/dL — ABNORMAL HIGH (ref 100–199)
HDL: 125 mg/dL (ref >39)
LDL Chol Calc (NIH): 101 mg/dL — ABNORMAL HIGH (ref 0–99)
Triglycerides: 44 mg/dL (ref 0–149)
VLDL Cholesterol Cal: 8 mg/dL (ref 5–40)

## 2023-08-21 LAB — TSH: TSH: 1.76 u[IU]/mL (ref 0.450–4.500)

## 2023-08-21 LAB — VITAMIN D 25 HYDROXY (VIT D DEFICIENCY, FRACTURES): Vit D, 25-Hydroxy: 45.5 ng/mL (ref 30.0–100.0)

## 2023-08-21 MED ORDER — DICLOFENAC SODIUM 75 MG PO TBEC: 75.0000 mg | DELAYED_RELEASE_TABLET | Freq: Two times a day (BID) | ORAL | 1 refills | Status: DC

## 2023-08-24 NOTE — Progress Notes (Signed)
Patient called.  Left message for patient to call back.

## 2023-09-01 ENCOUNTER — Other Ambulatory Visit: Payer: Self-pay | Admitting: Internal Medicine

## 2023-09-01 DIAGNOSIS — M542 Cervicalgia: Secondary | ICD-10-CM

## 2023-09-17 ENCOUNTER — Encounter: Payer: Self-pay | Admitting: Internal Medicine

## 2023-09-17 ENCOUNTER — Ambulatory Visit: Payer: BC Managed Care – PPO | Admitting: Internal Medicine

## 2023-09-17 VITALS — BP 124/80 | HR 86 | Temp 98.0°F | Resp 18 | Ht 66.0 in | Wt 164.1 lb

## 2023-09-17 DIAGNOSIS — M542 Cervicalgia: Secondary | ICD-10-CM

## 2023-09-17 DIAGNOSIS — I10 Essential (primary) hypertension: Secondary | ICD-10-CM

## 2023-09-17 NOTE — Progress Notes (Signed)
   Office Visit  Subjective   Patient ID: Tara Sharp   DOB: Apr 09, 1966   Age: 57 y.o.   MRN: [id number]   Chief Complaint Chief Complaint  Patient presents with   Follow-up    1 month     History of Present Illness 57 years old female with left arm weakness. She has finished PT and has seen chiropractice and her insurance has approved MRI neck.  She says that her pain is better but she still has a tingling feeling in her left arm and physical therapist has helped but weakness continue to remain.  Physical therapist also recommended that she will need MRI as she is not making further progress.  She says that she received a letter from her insurance and they have approved MRI.  I have spoken with the physician with the peer to peer review to discuss her case and they have approved the MRI with contrast. I have reviewed her labs with her.  Her liver function is back to normal.  She tells me that she has stopped drinking. She has hypertension and her blood pressure is controlled.  Past Medical History Past Medical History:  Diagnosis Date   Rheumatic fever    age 1 years old     Allergies No Known Allergies   Review of Systems Review of Systems  Constitutional: Negative.   HENT: Negative.    Respiratory: Negative.    Cardiovascular: Negative.   Gastrointestinal: Negative.   Musculoskeletal:        Left arm weakness and tingling feeling.  Neurological: Negative.        Objective:    Vitals BP 124/80 (BP Location: Left Arm, Patient Position: Sitting, Cuff Size: Normal)   Pulse 86   Temp 98 F (36.7 C)   Resp 18   Ht 5\' 6"  (1.676 m)   Wt 164 lb 2 oz (74.4 kg)   LMP 04/15/2017   SpO2 96%   BMI 26.49 kg/m    Physical Examination Physical Exam Constitutional:      Appearance: Normal appearance.  Cardiovascular:     Rate and Rhythm: Normal rate and regular rhythm.     Heart sounds: Normal heart sounds.  Pulmonary:     Effort: Pulmonary effort is normal.      Breath sounds: Normal breath sounds.  Neurological:     Mental Status: She is alert.     Comments: Her left arm strength his 3 out of 5 as compared to right arm.        Assessment & Plan:   Primary hypertension Controlled  Neck pain on left side She still has weakness in left arm slowly getting worse since May 24. Her insurance approve MRI but was not scheduled yet. I will schedule MRI cervical spine. She will continue with NSAID and gabapentin.    Return in about 3 months (around 12/18/2023).   Eloisa Northern, MD

## 2023-09-17 NOTE — Assessment & Plan Note (Addendum)
She still has weakness in left arm slowly getting worse since May 24. Her insurance approve MRI but was not scheduled yet. I will schedule MRI cervical spine. She will continue with NSAID and gabapentin.

## 2023-09-17 NOTE — Assessment & Plan Note (Signed)
Controlled.  

## 2023-09-18 NOTE — Progress Notes (Signed)
Patient called.  Patient aware.  

## 2023-09-25 ENCOUNTER — Other Ambulatory Visit: Payer: Self-pay | Admitting: Internal Medicine

## 2023-09-27 ENCOUNTER — Ambulatory Visit (HOSPITAL_BASED_OUTPATIENT_CLINIC_OR_DEPARTMENT_OTHER): Payer: BC Managed Care – PPO

## 2023-10-02 ENCOUNTER — Telehealth (HOSPITAL_BASED_OUTPATIENT_CLINIC_OR_DEPARTMENT_OTHER): Payer: Self-pay

## 2023-10-04 ENCOUNTER — Ambulatory Visit (HOSPITAL_BASED_OUTPATIENT_CLINIC_OR_DEPARTMENT_OTHER): Admission: RE | Admit: 2023-10-04 | Payer: BC Managed Care – PPO | Source: Ambulatory Visit

## 2023-10-06 ENCOUNTER — Telehealth (HOSPITAL_BASED_OUTPATIENT_CLINIC_OR_DEPARTMENT_OTHER): Payer: Self-pay | Admitting: Internal Medicine

## 2023-10-07 ENCOUNTER — Other Ambulatory Visit: Payer: Self-pay | Admitting: Internal Medicine

## 2023-10-07 DIAGNOSIS — G8929 Other chronic pain: Secondary | ICD-10-CM

## 2023-10-07 DIAGNOSIS — M542 Cervicalgia: Secondary | ICD-10-CM

## 2024-03-30 ENCOUNTER — Other Ambulatory Visit: Payer: Self-pay | Admitting: Internal Medicine

## 2024-03-30 MED ORDER — IRBESARTAN-HYDROCHLOROTHIAZIDE 150-12.5 MG PO TABS: 1.0000 | ORAL_TABLET | Freq: Every day | ORAL | 0 refills | Status: DC

## 2024-04-22 DIAGNOSIS — S20222A Contusion of left back wall of thorax, initial encounter: Secondary | ICD-10-CM | POA: Diagnosis not present

## 2024-04-22 DIAGNOSIS — S2232XA Fracture of one rib, left side, initial encounter for closed fracture: Secondary | ICD-10-CM | POA: Diagnosis not present

## 2024-04-22 DIAGNOSIS — J159 Unspecified bacterial pneumonia: Secondary | ICD-10-CM | POA: Diagnosis not present

## 2024-04-27 ENCOUNTER — Ambulatory Visit: Admitting: Internal Medicine

## 2024-04-27 ENCOUNTER — Encounter: Payer: Self-pay | Admitting: Internal Medicine

## 2024-04-27 ENCOUNTER — Other Ambulatory Visit: Payer: Self-pay | Admitting: Internal Medicine

## 2024-04-27 VITALS — BP 144/82 | HR 75 | Temp 97.7°F | Resp 18 | Ht 66.0 in | Wt 166.2 lb

## 2024-04-27 DIAGNOSIS — I10 Essential (primary) hypertension: Secondary | ICD-10-CM

## 2024-04-27 DIAGNOSIS — S2242XA Multiple fractures of ribs, left side, initial encounter for closed fracture: Secondary | ICD-10-CM | POA: Diagnosis not present

## 2024-04-27 DIAGNOSIS — J189 Pneumonia, unspecified organism: Secondary | ICD-10-CM | POA: Insufficient documentation

## 2024-04-27 MED ORDER — IRBESARTAN-HYDROCHLOROTHIAZIDE 150-12.5 MG PO TABS: 1.0000 | ORAL_TABLET | Freq: Every day | ORAL | 3 refills | Status: DC

## 2024-04-27 MED ORDER — LIDOCAINE 5 % EX PTCH: 1.0000 | MEDICATED_PATCH | CUTANEOUS | 0 refills | Status: AC

## 2024-04-27 MED ORDER — GUAIFENESIN ER 600 MG PO TB12: 600.0000 mg | ORAL_TABLET | Freq: Two times a day (BID) | ORAL | 0 refills | Status: AC

## 2024-04-27 MED ORDER — AZITHROMYCIN 250 MG PO TABS: ORAL_TABLET | ORAL | 0 refills | Status: AC

## 2024-04-27 NOTE — Assessment & Plan Note (Signed)
 Her blood pressure is slightly high, will monitor.

## 2024-04-27 NOTE — Assessment & Plan Note (Signed)
 She will apply a Lidoderm patch at a tender spot.

## 2024-04-27 NOTE — Progress Notes (Signed)
   Office Visit  Subjective   Patient ID: Tara Sharp   DOB: December 25, 1965   Age: 58 y.o.   MRN: [id number]   Chief Complaint Chief Complaint  Patient presents with   office visit    Patient feels broke rib 2 weeks ago      History of Present Illness   58 years old female who fell in tub and have left posterior rib fracture.  She also developed pneumonia and was started on doxycycline 100 mg twice a day.  She says that she hurts so much in her posterior back and she has increased cough that aggravate her pain.  She is not sleeping because of pain.  She is still coughing up greenish-yellow phlegm.  She told me that she has been wheezing but she has no history of asthma or COPD.  She does not smoke.  No fever or chills.    She has hypertension and her blood pressure is slightly elevated today.  She is in pain.  Past Medical History Past Medical History:  Diagnosis Date   Rheumatic fever    age 64 years old     Allergies No Known Allergies   Review of Systems Review of Systems  Constitutional: Negative.   HENT: Negative.    Respiratory:  Positive for cough, shortness of breath and wheezing.   Cardiovascular:  Positive for chest pain.  Gastrointestinal: Negative.   Neurological: Negative.        Objective:    Vitals BP (!) 144/82   Pulse 75   Temp 97.7 F (36.5 C)   Resp 18   Ht 5\' 6"  (1.676 m)   Wt 166 lb 4 oz (75.4 kg)   LMP 04/15/2017   SpO2 97%   BMI 26.83 kg/m    Physical Examination Physical Exam Constitutional:      Appearance: Normal appearance.  HENT:     Head: Normocephalic and atraumatic.  Cardiovascular:     Rate and Rhythm: Normal rate and regular rhythm.     Heart sounds: Normal heart sounds.  Pulmonary:     Effort: Pulmonary effort is normal.     Breath sounds: Wheezing present.  Abdominal:     General: Bowel sounds are normal.     Palpations: Abdomen is soft.     Tenderness: There is abdominal tenderness.  Neurological:     General: No  focal deficit present.     Mental Status: She is alert and oriented to person, place, and time.        Assessment & Plan:   Primary hypertension   Her blood pressure is slightly high, will monitor.  Pneumonia due to infectious organism   I will send Z-Pak for her and also start her on Mucinex 600 mg twice a day for 2 weeks  Multiple closed fractures of ribs of left side  She will apply a Lidoderm patch at a tender spot.    Return in about 1 month (around 05/27/2024).   Tita Form, MD

## 2024-04-27 NOTE — Assessment & Plan Note (Signed)
 I will send Z-Pak for her and also start her on Mucinex 600 mg twice a day for 2 weeks

## 2024-06-08 ENCOUNTER — Encounter: Payer: Self-pay | Admitting: Internal Medicine

## 2024-06-08 ENCOUNTER — Ambulatory Visit: Admitting: Internal Medicine

## 2024-06-08 VITALS — BP 124/84 | HR 80 | Temp 98.1°F | Resp 18 | Ht 66.0 in | Wt 170.2 lb

## 2024-06-08 DIAGNOSIS — I10 Essential (primary) hypertension: Secondary | ICD-10-CM | POA: Diagnosis not present

## 2024-06-08 DIAGNOSIS — Z6826 Body mass index (BMI) 26.0-26.9, adult: Secondary | ICD-10-CM | POA: Insufficient documentation

## 2024-06-08 DIAGNOSIS — Z6827 Body mass index (BMI) 27.0-27.9, adult: Secondary | ICD-10-CM | POA: Insufficient documentation

## 2024-06-08 MED ORDER — PHENTERMINE HCL 15 MG PO CAPS: 15.0000 mg | ORAL_CAPSULE | ORAL | 1 refills | Status: DC

## 2024-06-08 NOTE — Progress Notes (Signed)
   Office Visit  Subjective   Patient ID: Tara Sharp   DOB: 02/20/1966   Age: 58 y.o.   MRN: [id number]   Chief Complaint Chief Complaint  Patient presents with   Follow-up    1 month follow up     History of Present Illness 58 years old female who is here for follow-up.  She says that her shortness of breath and cough has resolved.  She tells me that she watch her diet but struggled to lose weight.  She has gained 4 lb since last visit.  Her BMI is 27. She says that she has stay very active but unable to lose weight and she is worried that she will keep gaining more weight.    She has hypertension and her blood pressure is controlled.  She takes irbesartan  -hydrochlorothiazide  150-12.5 mg 1 tablets daily.  Her blood pressure is well controlled.    She says that her left arm weakness is slowly getting better.  She is using robotic gloves that is helping her.  She says that she start using gabapentin .  Past Medical History Past Medical History:  Diagnosis Date   Rheumatic fever    age 77 years old     Allergies No Known Allergies   Review of Systems Review of Systems  Constitutional: Negative.   HENT: Negative.    Respiratory: Negative.    Cardiovascular: Negative.   Gastrointestinal: Negative.   Neurological: Negative.        Objective:    Vitals BP 124/84   Pulse 80   Temp 98.1 F (36.7 C)   Resp 18   Ht 5' 6 (1.676 m)   Wt 170 lb 4 oz (77.2 kg)   LMP 04/15/2017   SpO2 97%   BMI 27.48 kg/m    Physical Examination Physical Exam Constitutional:      Appearance: Normal appearance.  Cardiovascular:     Rate and Rhythm: Normal rate and regular rhythm.     Heart sounds: Normal heart sounds.  Pulmonary:     Effort: Pulmonary effort is normal.     Breath sounds: Normal breath sounds.  Abdominal:     General: Bowel sounds are normal.     Palpations: Abdomen is soft.  Neurological:     Mental Status: She is alert and oriented to person, place, and time.  Mental status is at baseline.        Assessment & Plan:   Primary hypertension   Her blood pressure is controlled.  BMI 27.0-27.9,adult   I have discussed diet with her.  She will cut down portion of her meal.  She will increase her activity and I will start her on low-dose of phentermine  to curb her appetite as well.  She will continue to watch her diet and monitor her pulse and blood pressure.    Return in about 3 months (around 09/08/2024) for For PE on next visit.   Roetta Dare, MD

## 2024-06-08 NOTE — Assessment & Plan Note (Signed)
 I have discussed diet with her.  She will cut down portion of her meal.  She will increase her activity and I will start her on low-dose of phentermine  to curb her appetite as well.  She will continue to watch her diet and monitor her pulse and blood pressure.

## 2024-06-08 NOTE — Assessment & Plan Note (Signed)
 Her blood pressure is controlled.

## 2024-07-08 ENCOUNTER — Other Ambulatory Visit: Payer: Self-pay | Admitting: Internal Medicine

## 2024-08-02 ENCOUNTER — Other Ambulatory Visit: Payer: Self-pay | Admitting: Internal Medicine

## 2024-08-02 MED ORDER — PHENTERMINE HCL 15 MG PO CAPS: 15.0000 mg | ORAL_CAPSULE | ORAL | 0 refills | Status: DC

## 2024-08-04 ENCOUNTER — Other Ambulatory Visit: Payer: Self-pay | Admitting: Internal Medicine

## 2024-09-06 ENCOUNTER — Ambulatory Visit: Admitting: Internal Medicine

## 2024-09-06 ENCOUNTER — Encounter: Payer: Self-pay | Admitting: Internal Medicine

## 2024-09-06 VITALS — BP 124/74 | HR 93 | Temp 98.1°F | Ht 66.0 in | Wt 166.1 lb

## 2024-09-06 DIAGNOSIS — Z6826 Body mass index (BMI) 26.0-26.9, adult: Secondary | ICD-10-CM | POA: Diagnosis not present

## 2024-09-06 DIAGNOSIS — I10 Essential (primary) hypertension: Secondary | ICD-10-CM | POA: Diagnosis not present

## 2024-09-06 DIAGNOSIS — Z1231 Encounter for screening mammogram for malignant neoplasm of breast: Secondary | ICD-10-CM | POA: Insufficient documentation

## 2024-09-06 DIAGNOSIS — Z131 Encounter for screening for diabetes mellitus: Secondary | ICD-10-CM | POA: Diagnosis not present

## 2024-09-06 MED ORDER — PHENTERMINE HCL 15 MG PO CAPS: 15.0000 mg | ORAL_CAPSULE | ORAL | 0 refills | Status: DC

## 2024-09-06 NOTE — Assessment & Plan Note (Signed)
I will do hemoglobin A1c today.

## 2024-09-06 NOTE — Assessment & Plan Note (Signed)
 Will continue with phentermine .  She will continue to watch her diet and do regular exercise.

## 2024-09-06 NOTE — Progress Notes (Signed)
   Office Visit  Subjective   Patient ID: Tara Sharp   DOB: 1966-07-02   Age: 58 y.o.   MRN: [id number]   Chief Complaint Chief Complaint  Patient presents with   Annual Exam    Physical exam      History of Present Illness 58 years old female who is here for follow-up.   She denies any side effects from phentermine .  She monitor her pulse.  She has lost 4 lb since last visit.  Her weight is 166 lb with BMI of 26. She will continue to watch her diet and exercise.   She has hypertension and her blood pressure is controlled.  She takes irbesartan  -hydrochlorothiazide  150-12.5 mg 1 tablets daily.  Her blood pressure is well controlled.  Her last blood drawn was a year ago so I will do CMP today.   She says that her left arm weakness is slowly getting better.  She is using robotic gloves that is helping her.      She do not have mammogram in a while and I have suggested that she need to have a physical and I will schedule her mammogram for breast cancer screening.  She will get flu shot and COVID vaccine from [employer].   Past Medical History Past Medical History:  Diagnosis Date   Rheumatic fever    age 74 years old     Allergies No Known Allergies   Review of Systems Review of Systems  Constitutional: Negative.   HENT: Negative.    Respiratory: Negative.    Cardiovascular: Negative.   Gastrointestinal: Negative.   Neurological: Negative.        Objective:    Vitals BP 124/74   Pulse 93   Temp 98.1 F (36.7 C)   Ht 5' 6 (1.676 m)   Wt 166 lb 2 oz (75.4 kg)   LMP 04/15/2017   SpO2 97%   BMI 26.81 kg/m    Physical Examination Physical Exam Constitutional:      Appearance: Normal appearance.  HENT:     Head: Normocephalic and atraumatic.  Cardiovascular:     Rate and Rhythm: Normal rate and regular rhythm.     Heart sounds: Normal heart sounds.  Pulmonary:     Effort: Pulmonary effort is normal.     Breath sounds: Normal breath sounds.  Neurological:      General: No focal deficit present.     Mental Status: She is alert and oriented to person, place, and time.        Assessment & Plan:   Primary hypertension   Her blood pressure is controlled.  I will continue with current treatment.  I will do BMP today.  Screening for diabetes mellitus (DM)   I will do hemoglobin A1c today.  Encounter for screening mammogram for malignant neoplasm of breast   I will schedule mamm[facility] at [facility].  BMI 26.0-26.9,adult   Will continue with phentermine .  She will continue to watch her diet and do regular exercise.    Return in about 2 months (around 11/06/2024) for PE on next visit.   Roetta Dare, MD

## 2024-09-06 NOTE — Assessment & Plan Note (Signed)
 Her blood pressure is controlled.  I will continue with current treatment.  I will do BMP today.

## 2024-09-06 NOTE — Assessment & Plan Note (Signed)
 I will schedule mammogram at [facility].

## 2024-09-07 LAB — CMP14 + ANION GAP
ALT: 55 IU/L — ABNORMAL HIGH (ref 0–32)
AST: 60 IU/L — ABNORMAL HIGH (ref 0–40)
Albumin: 4.3 g/dL (ref 3.8–4.9)
Alkaline Phosphatase: 103 IU/L (ref 49–135)
Anion Gap: 17 mmol/L (ref 10.0–18.0)
BUN/Creatinine Ratio: 19 (ref 9–23)
BUN: 11 mg/dL (ref 6–24)
Bilirubin Total: 1 mg/dL (ref 0.0–1.2)
CO2: 24 mmol/L (ref 20–29)
Calcium: 9.5 mg/dL (ref 8.7–10.2)
Chloride: 97 mmol/L (ref 96–106)
Creatinine, Ser: 0.59 mg/dL (ref 0.57–1.00)
Globulin, Total: 3 g/dL (ref 1.5–4.5)
Glucose: 93 mg/dL (ref 70–99)
Potassium: 4.1 mmol/L (ref 3.5–5.2)
Sodium: 138 mmol/L (ref 134–144)
Total Protein: 7.3 g/dL (ref 6.0–8.5)
eGFR: 104 mL/min/1.73 (ref >59)

## 2024-09-07 LAB — LIPID PANEL
Chol/HDL Ratio: 1.8 ratio (ref 0.0–4.4)
Cholesterol, Total: 256 mg/dL — ABNORMAL HIGH (ref 100–199)
HDL: 141 mg/dL (ref >39)
LDL Chol Calc (NIH): 107 mg/dL — ABNORMAL HIGH (ref 0–99)
Triglycerides: 48 mg/dL (ref 0–149)
VLDL Cholesterol Cal: 8 mg/dL (ref 5–40)

## 2024-09-07 LAB — HEMOGLOBIN A1C
Est. average glucose Bld gHb Est-mCnc: 105 mg/dL
Hgb A1c MFr Bld: 5.3 % (ref 4.8–5.6)

## 2024-09-09 ENCOUNTER — Ambulatory Visit: Payer: Self-pay

## 2024-09-19 NOTE — Progress Notes (Signed)
 Patient called.  Unable to reach patient.Mailed letter.

## 2024-09-26 ENCOUNTER — Ambulatory Visit (HOSPITAL_BASED_OUTPATIENT_CLINIC_OR_DEPARTMENT_OTHER)
Admission: RE | Admit: 2024-09-26 | Discharge: 2024-09-26 | Disposition: A | Discharge status: Home / Self Care | Source: Ambulatory Visit | Attending: Internal Medicine | Admitting: Internal Medicine

## 2024-09-26 ENCOUNTER — Encounter (HOSPITAL_BASED_OUTPATIENT_CLINIC_OR_DEPARTMENT_OTHER): Payer: Self-pay

## 2024-09-26 DIAGNOSIS — Z1231 Encounter for screening mammogram for malignant neoplasm of breast: Secondary | ICD-10-CM | POA: Insufficient documentation

## 2024-10-03 ENCOUNTER — Other Ambulatory Visit: Payer: Self-pay | Admitting: Internal Medicine

## 2024-10-28 ENCOUNTER — Other Ambulatory Visit: Payer: Self-pay | Admitting: Internal Medicine

## 2024-10-28 MED ORDER — PHENTERMINE HCL 15 MG PO CAPS: 15.0000 mg | ORAL_CAPSULE | ORAL | 0 refills | Status: DC

## 2024-11-08 ENCOUNTER — Encounter: Payer: Self-pay | Admitting: Internal Medicine

## 2024-11-08 ENCOUNTER — Ambulatory Visit: Admitting: Internal Medicine

## 2024-11-08 VITALS — BP 120/84 | HR 94 | Temp 97.7°F | Resp 18 | Ht 66.0 in | Wt 162.5 lb

## 2024-11-08 DIAGNOSIS — Z131 Encounter for screening for diabetes mellitus: Secondary | ICD-10-CM | POA: Diagnosis not present

## 2024-11-08 DIAGNOSIS — R7989 Other specified abnormal findings of blood chemistry: Secondary | ICD-10-CM | POA: Diagnosis not present

## 2024-11-08 DIAGNOSIS — I10 Essential (primary) hypertension: Secondary | ICD-10-CM | POA: Diagnosis not present

## 2024-11-08 DIAGNOSIS — Z0000 Encounter for general adult medical examination without abnormal findings: Secondary | ICD-10-CM | POA: Diagnosis not present

## 2024-11-08 DIAGNOSIS — M4125 Other idiopathic scoliosis, thoracolumbar region: Secondary | ICD-10-CM

## 2024-11-08 DIAGNOSIS — Z6826 Body mass index (BMI) 26.0-26.9, adult: Secondary | ICD-10-CM

## 2024-11-08 MED ORDER — PHENTERMINE HCL 15 MG PO CAPS: 15.0000 mg | ORAL_CAPSULE | ORAL | 2 refills | Status: AC

## 2024-11-08 MED ORDER — ESTRADIOL 0.5 MG PO TABS: 0.5000 mg | ORAL_TABLET | Freq: Every day | ORAL | 3 refills | Status: AC

## 2024-11-08 NOTE — Assessment & Plan Note (Signed)
 Her blood pressure is well controlled.  I will do CMP.

## 2024-11-08 NOTE — Assessment & Plan Note (Signed)
.    Liver function and will do hepatitis panel.  If hepatitis panel is normal and liver function remain abnormal then I will do liver ultrasound.

## 2024-11-08 NOTE — Assessment & Plan Note (Signed)
 Her scoliosis sometimes cause pain but overall she is doing good.

## 2024-11-08 NOTE — Assessment & Plan Note (Signed)
 She will continue to watch her diet and I will send phentermine  15 mg daily.

## 2024-11-08 NOTE — Assessment & Plan Note (Signed)
 Her immunizations are up-to-date.  I will send Cologuard.  She already had mammogram.  She will continue to watch her diet.

## 2024-11-08 NOTE — Progress Notes (Signed)
 Office Visit  Subjective   Patient ID: Tara Sharp   DOB: [birth year]   Age: 58 y.o.   MRN: [id number]   Chief Complaint Chief Complaint  Patient presents with   Annual Exam    Physical     History of Present Illness 58 years old female is here for annual physical examination. She is living with her boyfriend, she quit smoking 22 years ago, she occasionally drink. She work at [employer].   She get flu shot every year and she already got it. She has 2 doses of shingle vaccine, she has pneumonia vaccine last year. She has COVID vaccine. Her last tetanus booster was within last 10 years.   She has mammogram recently and that was normal. She never has colonoscopy and wanted stool test. She has hysterectomy and do not get pap smear.  Her father has renal carcinoma. Her mother has   diabetes.   She monitor her pulse.  She has lost 4 lb since last visit.  Her weight is 166 lb with BMI of 26. She will continue to watch her diet and exercise.   She has hypertension and her blood pressure is controlled.  She takes irbesartan  -hydrochlorothiazide  150-12.5 mg 1 tablets daily.  Her blood pressure is well controlled.  Her last blood drawn was a year ago so I will do CMP today.  She says that her left arm weakness is slowly getting better. She is using robotic gloves that is helping her.She received physical and occupation therapy but that did not help.  She has a slight contracture in her left hand.    She has hot flashes and that bothers her and she wanted to start hormone replacement therapy.  She understands the risk.    She takes phentermine  for obesity and her BMI is 26.  She takes 15 mg of phentermine  without any side effects.  Will continue to monitor.    Past Medical History Past Medical History:  Diagnosis Date   Rheumatic fever    age 75 years old     Allergies Allergies[1]   Review of Systems Review of Systems  Constitutional: Negative.   HENT: Negative.    Respiratory:  Negative.    Cardiovascular: Negative.   Gastrointestinal: Negative.   Neurological: Negative.        Objective:    Vitals BP 120/84   Pulse 94   Temp 97.7 F (36.5 C)   Resp 18   Ht 5' 6 (1.676 m)   Wt 162 lb 8 oz (73.7 kg)   LMP 04/15/2017   SpO2 98%   BMI 26.23 kg/m    Physical Examination Physical Exam Constitutional:      Appearance: Normal appearance.  HENT:     Head: Normocephalic and atraumatic.  Eyes:     Extraocular Movements: Extraocular movements intact.     Pupils: Pupils are equal, round, and reactive to light.  Cardiovascular:     Rate and Rhythm: Normal rate and regular rhythm.     Heart sounds: Normal heart sounds.  Pulmonary:     Effort: Pulmonary effort is normal.     Breath sounds: Normal breath sounds.  Abdominal:     General: Bowel sounds are normal.     Palpations: Abdomen is soft.  Neurological:     Mental Status: She is alert.     Comments: Left arm weakness with left hand contracture        Assessment & Plan:   Primary hypertension  Her blood pressure is well controlled.  I will do CMP.  Scoliosis   Her scoliosis sometimes cause pain but overall she is doing good.  Screening for diabetes mellitus (DM)   I will do hemoglobin A1c  BMI 26.0-26.9,adult  She will continue to watch her diet and I will send phentermine  15 mg daily.  Abnormal liver function test  .  Liver function and will do hepatitis panel.  If hepatitis panel is normal and liver function remain abnormal then I will do liver ultrasound.  Annual physical exam   Her immunizations are up-to-date.  I will send Cologuard.  She already had mammogram.  She will continue to watch her diet.    Return in about 3 months (around 02/06/2025).[id number]   Juniel Groene, MD      [1] No Known Allergies

## 2024-11-08 NOTE — Assessment & Plan Note (Signed)
I will do hemoglobin A1c.

## 2024-11-09 LAB — CBC WITH DIFFERENTIAL/PLATELET
Basophils Absolute: 0 x10E3/uL (ref 0.0–0.2)
Basos: 0 %
EOS (ABSOLUTE): 0.1 x10E3/uL (ref 0.0–0.4)
Eos: 1 %
Hematocrit: 40.7 % (ref 34.0–46.6)
Hemoglobin: 14.2 g/dL (ref 11.1–15.9)
Immature Grans (Abs): 0 x10E3/uL (ref 0.0–0.1)
Immature Granulocytes: 0 %
Lymphocytes Absolute: 1.4 x10E3/uL (ref 0.7–3.1)
Lymphs: 21 %
MCH: 34.5 pg — ABNORMAL HIGH (ref 26.6–33.0)
MCHC: 34.9 g/dL (ref 31.5–35.7)
MCV: 99 fL — ABNORMAL HIGH (ref 79–97)
Monocytes Absolute: 0.6 x10E3/uL (ref 0.1–0.9)
Monocytes: 9 %
Neutrophils Absolute: 4.6 x10E3/uL (ref 1.4–7.0)
Neutrophils: 69 %
Platelets: 257 x10E3/uL (ref 150–450)
RBC: 4.12 x10E6/uL (ref 3.77–5.28)
RDW: 11.8 % (ref 11.7–15.4)
WBC: 6.7 x10E3/uL (ref 3.4–10.8)

## 2024-11-09 LAB — CMP14 + ANION GAP
ALT: 27 IU/L (ref 0–32)
AST: 31 IU/L (ref 0–40)
Albumin: 4.3 g/dL (ref 3.8–4.9)
Alkaline Phosphatase: 106 IU/L (ref 49–135)
Anion Gap: 17 mmol/L (ref 10.0–18.0)
BUN/Creatinine Ratio: 13 (ref 12–28)
BUN: 7 mg/dL — ABNORMAL LOW (ref 8–27)
Bilirubin Total: 0.7 mg/dL (ref 0.0–1.2)
CO2: 25 mmol/L (ref 20–29)
Calcium: 9.9 mg/dL (ref 8.7–10.3)
Chloride: 98 mmol/L (ref 96–106)
Creatinine, Ser: 0.54 mg/dL — ABNORMAL LOW (ref 0.57–1.00)
Globulin, Total: 3.2 g/dL (ref 1.5–4.5)
Glucose: 92 mg/dL (ref 70–99)
Potassium: 4.7 mmol/L (ref 3.5–5.2)
Sodium: 140 mmol/L (ref 134–144)
Total Protein: 7.5 g/dL (ref 6.0–8.5)
eGFR: 105 mL/min/1.73 (ref >59)

## 2024-11-09 LAB — TSH: TSH: 1.93 u[IU]/mL (ref 0.450–4.500)

## 2024-11-09 LAB — HEMOGLOBIN A1C
Est. average glucose Bld gHb Est-mCnc: 108 mg/dL
Hgb A1c MFr Bld: 5.4 % (ref 4.8–5.6)

## 2024-11-09 LAB — LIPID PANEL
Chol/HDL Ratio: 1.9 ratio (ref 0.0–4.4)
Cholesterol, Total: 237 mg/dL — ABNORMAL HIGH (ref 100–199)
HDL: 125 mg/dL (ref >39)
LDL Chol Calc (NIH): 101 mg/dL — ABNORMAL HIGH (ref 0–99)
Triglycerides: 68 mg/dL (ref 0–149)
VLDL Cholesterol Cal: 11 mg/dL (ref 5–40)

## 2024-11-09 LAB — VITAMIN D 25 HYDROXY (VIT D DEFICIENCY, FRACTURES): Vit D, 25-Hydroxy: 25.9 ng/mL — ABNORMAL LOW (ref 30.0–100.0)

## 2024-11-10 ENCOUNTER — Ambulatory Visit: Payer: Self-pay

## 2024-11-29 NOTE — Progress Notes (Signed)
 Patient called.  Unable to reach patient. Send letter   Dr caleen : patient called

## 2025-02-14 ENCOUNTER — Ambulatory Visit: Admitting: Internal Medicine
